# Patient Record
Sex: Male | Born: 1980 | Race: Black or African American | Hispanic: No | Marital: Single | Smoking: Never smoker
Health system: Southern US, Community
[De-identification: ages and names within clinical notes are randomized; demographics above are authoritative.]

---

## 2017-07-27 ENCOUNTER — Other Ambulatory Visit: Payer: Self-pay

## 2017-07-27 ENCOUNTER — Emergency Department (HOSPITAL_COMMUNITY)
Admission: EM | Admit: 2017-07-27 | Discharge: 2017-07-27 | Disposition: A | Discharge status: Home / Self Care | Payer: Self-pay | Attending: Emergency Medicine | Admitting: Emergency Medicine

## 2017-07-27 ENCOUNTER — Encounter (HOSPITAL_COMMUNITY): Payer: Self-pay | Admitting: Emergency Medicine

## 2017-07-27 DIAGNOSIS — J039 Acute tonsillitis, unspecified: Secondary | ICD-10-CM | POA: Insufficient documentation

## 2017-07-27 LAB — RAPID STREP SCREEN (MED CTR MEBANE ONLY): STREPTOCOCCUS, GROUP A SCREEN (DIRECT): NEGATIVE

## 2017-07-27 MED ORDER — CLINDAMYCIN HCL 300 MG PO CAPS: 300.0000 mg | ORAL_CAPSULE | Freq: Three times a day (TID) | ORAL | 0 refills | Status: AC

## 2017-07-27 MED ORDER — CLINDAMYCIN HCL 150 MG PO CAPS
300.0000 mg | ORAL_CAPSULE | Freq: Once | ORAL | Status: AC
  Administered 2017-07-27: 300 mg via ORAL
  Filled 2017-07-27: qty 2

## 2017-07-27 MED ORDER — IBUPROFEN 800 MG PO TABS
800.0000 mg | ORAL_TABLET | Freq: Once | ORAL | Status: AC
  Administered 2017-07-27: 800 mg via ORAL
  Filled 2017-07-27: qty 1

## 2017-07-27 NOTE — Discharge Instructions (Signed)
You may alternate Tylenol 1000 mg every 6 hours as needed for pain and Ibuprofen 800 mg every 8 hours as needed for pain.  Please take Ibuprofen with food.   You may use warm salt water gargles, over-the-counter Chloraseptic spray and throat lozenges to help with your sore throat.   To find a primary care or specialty doctor please call [phone] or [phone] to access "[place] [facility] Find a Doctor Service."  You may also go on the [facility] website at [email or web address]  There are also multiple [facility], [facility], [facility] and [facility] practices throughout the [place] that are frequently accepting new patients. You may find a clinic that is close to your home and contact them.  [facility] -  [address] [place] [id number] [phone]   [facility] -  [address] [place] [address] [phone]   [facility] [phone]  [place] [phone]

## 2017-07-27 NOTE — ED Provider Notes (Signed)
TIME SEEN: 6:49 AM  CHIEF COMPLAINT: Sore throat  HPI: Patient is a 36 year old male with no significant past medical history who presents to the emergency department with sore throat for the past couple of days.  He denies any fevers, cough, vomiting or diarrhea.  Has bilateral ear pain.  No sick contacts or recent travel.  ROS: See HPI Constitutional: no fever  Eyes: no drainage  ENT: no runny nose   Cardiovascular:  no chest pain  Resp: no SOB  GI: no vomiting GU: no dysuria Integumentary: no rash  Allergy: no hives  Musculoskeletal: no leg swelling  Neurological: no slurred speech ROS otherwise negative  PAST MEDICAL HISTORY/PAST SURGICAL HISTORY:  History reviewed. No pertinent past medical history.  MEDICATIONS:  Prior to Admission medications   Not on File    ALLERGIES:  No Known Allergies  SOCIAL HISTORY:  Social History   Tobacco Use  . Smoking status: Never Smoker  . Smokeless tobacco: Never Used  Substance Use Topics  . Alcohol use: No    Frequency: Never    FAMILY HISTORY: No family history on file.  EXAM: BP 127/86 (BP Location: Right Arm)   Pulse 69   Temp 98.4 F (36.9 C) (Oral)   Resp 18   SpO2 96%  CONSTITUTIONAL: Alert and oriented and responds appropriately to questions. Well-appearing; well-nourished HEAD: Normocephalic EYES: Conjunctivae clear, pupils appear equal, EOMI ENT: normal nose; moist mucous membranes; TMs are clear bilaterally without erythema, purulence, bulging, perforation, effusion.  No cerumen impaction or sign of foreign body in the external auditory canal. No inflammation, erythema or drainage from the external auditory canal. No signs of mastoiditis. No pain with manipulation of the pinna bilaterally.  Patient has bilateral tonsillar hypertrophy without exudate but his tonsils are not touching, posterior pharyngeal erythema without petechiae noted, no uvular deviation, no unilateral swelling, no trismus or drooling, no  muffled voice, normal phonation, no stridor, no dental caries present, no drainable dental abscess noted, no Ludwig's angina, tongue sits flat in the bottom of the mouth, no angioedema, no facial erythema or warmth, no facial swelling; no pain with movement of the neck. NECK: Supple, no meningismus, no nuchal rigidity, no LAD  CARD: RRR; S1 and S2 appreciated; no murmurs, no clicks, no rubs, no gallops RESP: Normal chest excursion without splinting or tachypnea; breath sounds clear and equal bilaterally; no wheezes, no rhonchi, no rales, no hypoxia or respiratory distress, speaking full sentences ABD/GI: Normal bowel sounds; non-distended; soft, non-tender, no rebound, no guarding, no peritoneal signs, no hepatosplenomegaly BACK:  The back appears normal and is non-tender to palpation, there is no CVA tenderness EXT: Normal ROM in all joints; non-tender to palpation; no edema; normal capillary refill; no cyanosis, no calf tenderness or swelling    SKIN: Normal color for age and race; warm; no rash NEURO: Moves all extremities equally PSYCH: The patient's mood and manner are appropriate. Grooming and personal hygiene are appropriate.  MEDICAL DECISION MAKING: Patient here with tonsillitis.  His strep test is negative but I am concerned that this could be bacterial in nature.  Will discharge on clindamycin.  He is otherwise well-appearing.  Doubt PTA, deep space neck infection, meningitis, pneumonia.  Recommended alternating Tylenol and Motrin for pain, warm salt water gargles, over-the-counter Chloraseptic spray and throat lozenges.  Will give outpatient PCP follow-up information.  Will give coupon for clindamycin given he does not have insurance.  Discussed return precautions.   Will provide work note.   At  this time, I do not feel there is any life-threatening condition present. I have reviewed and discussed all results (EKG, imaging, lab, urine as appropriate) and exam findings with patient/family. I  have reviewed nursing notes and appropriate previous records.  I feel the patient is safe to be discharged home without further emergent workup and can continue workup as an outpatient as needed. Discussed usual and customary return precautions. Patient/family verbalize understanding and are comfortable with this plan.  Outpatient follow-up has been provided if needed. All questions have been answered.      Ward, Layla MawKristen N, DO 07/27/17 [phone]

## 2017-07-27 NOTE — ED Triage Notes (Signed)
Pt c/o sore throat and ear pain since yesterday morning not getting any better.

## 2017-07-29 LAB — CULTURE, GROUP A STREP (THRC)

## 2018-01-26 ENCOUNTER — Emergency Department (HOSPITAL_COMMUNITY)
Admission: EM | Admit: 2018-01-26 | Discharge: 2018-01-26 | Disposition: A | Discharge status: Home / Self Care | Payer: Self-pay | Attending: Emergency Medicine | Admitting: Emergency Medicine

## 2018-01-26 ENCOUNTER — Encounter (HOSPITAL_COMMUNITY): Payer: Self-pay | Admitting: Emergency Medicine

## 2018-01-26 ENCOUNTER — Other Ambulatory Visit: Payer: Self-pay

## 2018-01-26 DIAGNOSIS — K2900 Acute gastritis without bleeding: Secondary | ICD-10-CM | POA: Insufficient documentation

## 2018-01-26 LAB — COMPREHENSIVE METABOLIC PANEL WITH GFR
ALT: 32 U/L (ref 17–63)
AST: 27 U/L (ref 15–41)
Albumin: 3.9 g/dL (ref 3.5–5.0)
Alkaline Phosphatase: 70 U/L (ref 38–126)
Anion gap: 11 (ref 5–15)
BUN: 9 mg/dL (ref 6–20)
CO2: 25 mmol/L (ref 22–32)
Calcium: 9.3 mg/dL (ref 8.9–10.3)
Chloride: 104 mmol/L (ref 101–111)
Creatinine, Ser: 0.93 mg/dL (ref 0.61–1.24)
GFR calc Af Amer: >60 mL/min
GFR calc non Af Amer: >60 mL/min
Glucose, Bld: 78 mg/dL (ref 65–99)
Potassium: 3.5 mmol/L (ref 3.5–5.1)
Sodium: 140 mmol/L (ref 135–145)
Total Bilirubin: 0.7 mg/dL (ref 0.3–1.2)
Total Protein: 7.5 g/dL (ref 6.5–8.1)

## 2018-01-26 LAB — CBC
HCT: 39.5 % (ref 39.0–52.0)
HEMOGLOBIN: 12.9 g/dL — AB (ref 13.0–17.0)
MCH: 29.8 pg (ref 26.0–34.0)
MCHC: 32.7 g/dL (ref 30.0–36.0)
MCV: 91.2 fL (ref 78.0–100.0)
Platelets: 293 10*3/uL (ref 150–400)
RBC: 4.33 MIL/uL (ref 4.22–5.81)
RDW: 13.9 % (ref 11.5–15.5)
WBC: 6 10*3/uL (ref 4.0–10.5)

## 2018-01-26 LAB — URINALYSIS, ROUTINE W REFLEX MICROSCOPIC
Bilirubin Urine: NEGATIVE
Glucose, UA: NEGATIVE mg/dL
Hgb urine dipstick: NEGATIVE
Ketones, ur: 5 mg/dL — AB
LEUKOCYTES UA: NEGATIVE
NITRITE: NEGATIVE
Protein, ur: NEGATIVE mg/dL
SPECIFIC GRAVITY, URINE: 1.019 (ref 1.005–1.030)
pH: 6 (ref 5.0–8.0)

## 2018-01-26 LAB — LIPASE, BLOOD: Lipase: 33 U/L (ref 11–51)

## 2018-01-26 MED ORDER — RANITIDINE HCL 150 MG PO CAPS: 150.0000 mg | ORAL_CAPSULE | Freq: Every day | ORAL | 0 refills | Status: AC

## 2018-01-26 MED ORDER — OMEPRAZOLE 20 MG PO CPDR: 20.0000 mg | DELAYED_RELEASE_CAPSULE | Freq: Every day | ORAL | 0 refills | Status: AC

## 2018-01-26 NOTE — ED Provider Notes (Addendum)
[facility] EMERGENCY DEPARTMENT Provider Note   CSN: [id number] Arrival date & time: 01/26/18  1115     History   Chief Complaint Chief Complaint  Patient presents with  . Abdominal Pain    HPI Willie Moon is a 37 y.o. male.  HPI  37 year old male presents today with complaints of epigastric abdominal pain.  Patient notes several month history of intermittent pain in the epigastric region.  He notes this is worse after eating spicy or greasy foods.  Patient notes no abdominal pain at rest, non-tender abdomen, denies any lower abdominal pain fever chills nausea or vomiting.  Patient denies any change in stool or urine, no blood per rectum.  Patient has not tried any medications for this.  This is only present when eating spicy or fatty foods.  Patient was at work today and encouraged to come to the emergency room for a work note.  She notes occasional smoking history, occasional alcohol, no NSAID use.  History reviewed. No pertinent past medical history.  There are no active problems to display for this patient.   History reviewed. No pertinent surgical history.      Home Medications    Prior to Admission medications   Medication Sig Start Date End Date Taking? Authorizing Provider  clindamycin (CLEOCIN) 300 MG capsule Take 1 capsule (300 mg total) by mouth 3 (three) times daily. 07/27/17   Ward, Layla Maw, DO  omeprazole (PRILOSEC) 20 MG capsule Take 1 capsule (20 mg total) by mouth daily. 01/26/18   Marletta Bousquet, Tinnie Gens, PA-C  ranitidine (ZANTAC) 150 MG capsule Take 1 capsule (150 mg total) by mouth daily. 01/26/18   Eyvonne Mechanic, PA-C    Family History History reviewed. No pertinent family history.  Social History Social History   Tobacco Use  . Smoking status: Never Smoker  . Smokeless tobacco: Never Used  Substance Use Topics  . Alcohol use: No    Frequency: Never  . Drug use: No     Allergies   Patient has no known allergies.   Review  of Systems Review of Systems  All other systems reviewed and are negative.    Physical Exam Updated Vital Signs BP 128/90 (BP Location: Right Arm)   Pulse 63   Temp 99 F (37.2 C) (Oral)   Resp 16   SpO2 97%   Physical Exam  Constitutional: He is oriented to person, place, and time. He appears well-developed and well-nourished.  HENT:  Head: Normocephalic and atraumatic.  Eyes: Pupils are equal, round, and reactive to light. Conjunctivae are normal. Right eye exhibits no discharge. Left eye exhibits no discharge. No scleral icterus.  Neck: Normal range of motion. No JVD present. No tracheal deviation present.  Pulmonary/Chest: Effort normal. No stridor.  Abdominal:  Soft nontender abdomen no rebound guarding or masses  Neurological: He is alert and oriented to person, place, and time. Coordination normal.  Psychiatric: He has a normal mood and affect. His behavior is normal. Judgment and thought content normal.  Nursing note and vitals reviewed.    ED Treatments / Results  Labs (all labs ordered are listed, but only abnormal results are displayed) Labs Reviewed  CBC - Abnormal; Notable for the following components:      Result Value   Hemoglobin 12.9 (*)    All other components within normal limits  URINALYSIS, ROUTINE W REFLEX MICROSCOPIC - Abnormal; Notable for the following components:   Ketones, ur 5 (*)    All other components  within normal limits  LIPASE, BLOOD  COMPREHENSIVE METABOLIC PANEL    EKG None  Radiology No results found.  Procedures Procedures (including critical care time)  Medications Ordered in ED Medications - No data to display   Initial Impression / Assessment and Plan / ED Course  I have reviewed the triage vital signs and the nursing notes.  Pertinent labs & imaging results that were available during my care of the patient were reviewed by me and considered in my medical decision making (see chart for details).    Labs:  Urinalysis, lipase, CMP, CBC  Imaging:  Consults:  Therapeutics:  Discharge Meds: zantac, omeprazole   Assessment/Plan:   37 year old male presents today with likely gastritis.  No acute distress at time of evaluation.  Very low suspicion for cholecystitis, pancreatitis, gastric ulcer.  Patient started on Zantac, he will transition to omeprazole if symptoms do not improve, dietary counseling given.  Strict return precautions given outpatient follow-up information given.  Patient verbalized understanding and agreement to today's plan.  Final Clinical Impressions(s) / ED Diagnoses   Final diagnoses:  Acute gastritis without hemorrhage, unspecified gastritis type    ED Discharge Orders        Ordered    ranitidine (ZANTAC) 150 MG capsule  Daily     01/26/18 1318    omeprazole (PRILOSEC) 20 MG capsule  Daily     01/26/18 1318       Eyvonne MechanicHedges, Arnel Wymer, PA-C 01/26/18 1319    Tishie Altmann, Tinnie GensJeffrey, PA-C 01/26/18 1325    Arby BarrettePfeiffer, Marcy, MD 02/01/18 1623

## 2018-01-26 NOTE — Discharge Instructions (Signed)
Please read attached information. If you experience any new or worsening signs or symptoms please return to the emergency room for evaluation. Please follow-up with your primary care provider or specialist as discussed. Please use medication prescribed only as directed and discontinue taking if you have any concerning signs or symptoms.   °

## 2018-01-26 NOTE — ED Triage Notes (Signed)
Pt presents to ED for two days of abdominal cramping.  Denies n/v/d, denies changes in urination.  States he needs a work note.

## 2018-01-26 NOTE — ED Notes (Signed)
Upper abd pain started last night hurts worse when he eats greasy food and spicey  Last  BM today was good he states felt a little better but still hurts

## 2018-01-26 NOTE — ED Notes (Signed)
Patient changing into gown, states does not want to take pants off.

## 2019-04-01 ENCOUNTER — Encounter (HOSPITAL_COMMUNITY): Payer: Self-pay | Admitting: Emergency Medicine

## 2019-04-01 ENCOUNTER — Emergency Department (HOSPITAL_COMMUNITY)
Admission: EM | Admit: 2019-04-01 | Discharge: 2019-04-01 | Disposition: A | Discharge status: Home / Self Care | Payer: Medicaid Other | Attending: Emergency Medicine | Admitting: Emergency Medicine

## 2019-04-01 DIAGNOSIS — Z79899 Other long term (current) drug therapy: Secondary | ICD-10-CM | POA: Insufficient documentation

## 2019-04-01 DIAGNOSIS — L089 Local infection of the skin and subcutaneous tissue, unspecified: Secondary | ICD-10-CM | POA: Insufficient documentation

## 2019-04-01 MED ORDER — SULFAMETHOXAZOLE-TRIMETHOPRIM 800-160 MG PO TABS: 1.0000 | ORAL_TABLET | Freq: Two times a day (BID) | ORAL | 0 refills | Status: AC

## 2019-04-01 MED ORDER — HYDROCODONE-ACETAMINOPHEN 5-325 MG PO TABS
1.0000 | ORAL_TABLET | Freq: Once | ORAL | Status: AC
  Administered 2019-04-01: 1 via ORAL
  Filled 2019-04-01: qty 1

## 2019-04-01 NOTE — ED Triage Notes (Signed)
Pt here for eval of abscess to right thumb since yesterday.

## 2019-04-01 NOTE — Discharge Instructions (Addendum)
Soak finger 20 minutes 4 times a day
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
## 2019-06-26 ENCOUNTER — Emergency Department (HOSPITAL_COMMUNITY)
Admission: EM | Admit: 2019-06-26 | Discharge: 2019-06-26 | Disposition: A | Discharge status: Home / Self Care | Payer: Self-pay | Attending: Emergency Medicine | Admitting: Emergency Medicine

## 2019-06-26 ENCOUNTER — Other Ambulatory Visit: Payer: Self-pay

## 2019-06-26 ENCOUNTER — Encounter (HOSPITAL_COMMUNITY): Payer: Self-pay | Admitting: Emergency Medicine

## 2019-06-26 DIAGNOSIS — Z79899 Other long term (current) drug therapy: Secondary | ICD-10-CM | POA: Insufficient documentation

## 2019-06-26 DIAGNOSIS — F419 Anxiety disorder, unspecified: Secondary | ICD-10-CM | POA: Insufficient documentation

## 2019-06-26 MED ORDER — HYDROXYZINE HCL 25 MG PO TABS: 25.0000 mg | ORAL_TABLET | Freq: Four times a day (QID) | ORAL | 0 refills | Status: AC

## 2019-06-26 NOTE — ED Provider Notes (Signed)
[facility] EMERGENCY DEPARTMENT Provider Note   CSN: [id number] Arrival date & time: 06/26/19  1036    History   Chief Complaint Chief Complaint  Patient presents with  . Anxiety    HPI MAKIH STEFANKO is a 38 y.o. male.     HPI   38 year old male presents today with complaints of anxiety patient notes he was drinking this weekend.  After waking up he felt like he was having palpitations and slightly anxious.  Patient denies any significant chest pain or any other complaints.  Patient denies any significant past medical history of anxiety.  He denies any cardiac history, he notes he does not use drugs.        History reviewed. No pertinent past medical history.  There are no active problems to display for this patient.   History reviewed. No pertinent surgical history.      Home Medications    Prior to Admission medications   Medication Sig Start Date End Date Taking? Authorizing Provider  clindamycin (CLEOCIN) 300 MG capsule Take 1 capsule (300 mg total) by mouth 3 (three) times daily. 07/27/17   Ward, Delice Bison, DO  hydrOXYzine (ATARAX/VISTARIL) 25 MG tablet Take 1 tablet (25 mg total) by mouth every 6 (six) hours. 06/26/19   Beulah Capobianco, Dellis Filbert, PA-C  omeprazole (PRILOSEC) 20 MG capsule Take 1 capsule (20 mg total) by mouth daily. 01/26/18   [place] Nabor, Dellis Filbert, PA-C  ranitidine (ZANTAC) 150 MG capsule Take 1 capsule (150 mg total) by mouth daily. 01/26/18   Okey Regal, PA-C    Family History No family history on file.  Social History Social History   Tobacco Use  . Smoking status: Never Smoker  . Smokeless tobacco: Never Used  Substance Use Topics  . Alcohol use: Yes    Frequency: Never    Comment: socially   . Drug use: No     Allergies   Patient has no known allergies.   Review of Systems Review of Systems  All other systems reviewed and are negative.    Physical Exam Updated Vital Signs BP (!) 138/103 (BP Location: Left  Arm)   Pulse (!) 56   Temp 98.6 F (37 C) (Oral)   Resp 15   Ht 5\' 11"  (1.803 m)   Wt 97.5 kg   SpO2 97%   BMI 29.99 kg/m   Physical Exam Vitals signs and nursing note reviewed.  Constitutional:      Appearance: He is well-developed.  HENT:     Head: Normocephalic and atraumatic.  Eyes:     General: No scleral icterus.       Right eye: No discharge.        Left eye: No discharge.     Conjunctiva/sclera: Conjunctivae normal.     Pupils: Pupils are equal, round, and reactive to light.  Neck:     Musculoskeletal: Normal range of motion.     Vascular: No JVD.     Trachea: No tracheal deviation.  Cardiovascular:     Rate and Rhythm: Normal rate and regular rhythm.  Pulmonary:     Effort: Pulmonary effort is normal. No respiratory distress.     Breath sounds: Normal breath sounds. No stridor.  Neurological:     Mental Status: He is alert and oriented to person, place, and time.     Coordination: Coordination normal.  Psychiatric:        Behavior: Behavior normal.        Thought Content: Thought content  normal.        Judgment: Judgment normal.      ED Treatments / Results  Labs (all labs ordered are listed, but only abnormal results are displayed) Labs Reviewed - No data to display  EKG None  Radiology No results found.  Procedures Procedures (including critical care time)  Medications Ordered in ED Medications - No data to display   Initial Impression / Assessment and Plan / ED Course  I have reviewed the triage vital signs and the nursing notes.  Pertinent labs & imaging results that were available during my care of the patient were reviewed by me and considered in my medical decision making (see chart for details).      38 year old male presents today with anxiety.  He has no signs of cardiac etiology including ACS PE or any other life-threatening etiology.  Patient discharged with symptomatic care and return precautions.  He verbalized understanding  and agreement to today's plan.  Final Clinical Impressions(s) / ED Diagnoses   Final diagnoses:  Anxiety    ED Discharge Orders         Ordered    hydrOXYzine (ATARAX/VISTARIL) 25 MG tablet  Every 6 hours     06/26/19 1409           Eyvonne Mechanic, PA-C 06/26/19 1549    Lorre Nick, MD 06/28/19 1319

## 2019-06-26 NOTE — Discharge Instructions (Addendum)
Please read attached information. If you experience any new or worsening signs or symptoms please return to the emergency room for evaluation. Please follow-up with your primary care provider or specialist as discussed. Please use medication prescribed only as directed and discontinue taking if you have any concerning signs or symptoms.   °

## 2019-06-26 NOTE — ED Triage Notes (Signed)
Patient reports feeling very anxious and nervous. Denies any changes that would cause him to feel this way. Denies any ingestion. Denies any SI/HI. Requesting something to help him relax and calm down.

## 2019-08-08 ENCOUNTER — Other Ambulatory Visit: Payer: Medicaid Other

## 2020-02-25 ENCOUNTER — Emergency Department (HOSPITAL_COMMUNITY)
Admission: EM | Admit: 2020-02-25 | Discharge: 2020-02-25 | Disposition: A | Discharge status: Home / Self Care | Payer: Medicaid Other | Attending: Emergency Medicine | Admitting: Emergency Medicine

## 2020-02-25 ENCOUNTER — Emergency Department (HOSPITAL_COMMUNITY): Payer: Medicaid Other

## 2020-02-25 ENCOUNTER — Encounter (HOSPITAL_COMMUNITY): Payer: Self-pay

## 2020-02-25 ENCOUNTER — Other Ambulatory Visit: Payer: Self-pay

## 2020-02-25 DIAGNOSIS — M25512 Pain in left shoulder: Secondary | ICD-10-CM | POA: Insufficient documentation

## 2020-02-25 DIAGNOSIS — X509XXA Other and unspecified overexertion or strenuous movements or postures, initial encounter: Secondary | ICD-10-CM | POA: Insufficient documentation

## 2020-02-25 DIAGNOSIS — Z79899 Other long term (current) drug therapy: Secondary | ICD-10-CM | POA: Insufficient documentation

## 2020-02-25 DIAGNOSIS — Y9384 Activity, sleeping: Secondary | ICD-10-CM | POA: Insufficient documentation

## 2020-02-25 DIAGNOSIS — M542 Cervicalgia: Secondary | ICD-10-CM | POA: Insufficient documentation

## 2020-02-25 DIAGNOSIS — Y92003 Bedroom of unspecified non-institutional (private) residence as the place of occurrence of the external cause: Secondary | ICD-10-CM | POA: Insufficient documentation

## 2020-02-25 DIAGNOSIS — Y999 Unspecified external cause status: Secondary | ICD-10-CM | POA: Insufficient documentation

## 2020-02-25 MED ORDER — METHOCARBAMOL 500 MG PO TABS: 500.0000 mg | ORAL_TABLET | Freq: Two times a day (BID) | ORAL | 0 refills | Status: AC

## 2020-02-25 NOTE — Discharge Instructions (Addendum)
Please continue to monitor your symptoms.  Please take Tylenol ibuprofen for pain as described below.  I also prescribed you a muscle relaxer which you may use if your pain is severe.  Please do gentle stretching and minimize heavy lifting.  You may do range of motion exercises as discussed.  Please use Tylenol or ibuprofen for pain.  You may use 600 mg ibuprofen every 6 hours or 1000 mg of Tylenol every 6 hours.  You may choose to alternate between the 2.  This would be most effective.  Not to exceed 4 g of Tylenol within 24 hours.  Not to exceed 3200 mg ibuprofen 24 hours.  Your x-ray was reassuring with no evidence of fracture.  Follow-up with your primary care doctor.  I have also given you the number for orthopedic doctor as you can to have symptoms and need to have a follow-up appoint with them.

## 2020-02-25 NOTE — ED Notes (Signed)
Patient verbalizes understanding of discharge instructions. Opportunity for questioning and answers were provided. Armband removed by staff. Patient discharged from ED.  

## 2020-02-25 NOTE — ED Triage Notes (Signed)
Pt reports 2 days of left shoulder pain, worse with movement. Pt denies any injury or trauma.

## 2020-02-26 NOTE — ED Provider Notes (Signed)
[facility] EMERGENCY DEPARTMENT Provider Note   CSN: [id number] Arrival date & time: 02/25/20  1018     History Chief Complaint  Patient presents with  . Shoulder Pain    Willie Moon is a 39 y.o. male.  HPI  Patient is a 39 year old male presenting today with 2 days of left shoulder pain that is only present when moving the area.  He denies any injury or trauma but states that it first began aching when he woke up after was sleeping on it.  He states that he was in his usual bed denies any trauma or injuries.  Denies any numbness or tingling in his hand.  Endorses an [employer] operational [employer].  Has taken nothing prior to arrival for pain.  No chest pain, shortness of breath, nausea or diaphoresis.     History reviewed. No pertinent past medical history.  There are no problems to display for this patient.   History reviewed. No pertinent surgical history.     No family history on file.  Social History   Tobacco Use  . Smoking status: Never Smoker  . Smokeless tobacco: Never Used  Substance Use Topics  . Alcohol use: Yes    Comment: socially   . Drug use: No    Home Medications Prior to Admission medications   Medication Sig Start Date End Date Taking? Authorizing Provider  clindamycin (CLEOCIN) 300 MG capsule Take 1 capsule (300 mg total) by mouth 3 (three) times daily. 07/27/17   Ward, Layla Maw, DO  hydrOXYzine (ATARAX/VISTARIL) 25 MG tablet Take 1 tablet (25 mg total) by mouth every 6 (six) hours. 06/26/19   Hedges, Tinnie Gens, PA-C  methocarbamol (ROBAXIN) 500 MG tablet Take 1 tablet (500 mg total) by mouth 2 (two) times daily. 02/25/20   Gailen Shelter, PA  omeprazole (PRILOSEC) 20 MG capsule Take 1 capsule (20 mg total) by mouth daily. 01/26/18   Hedges, Tinnie Gens, PA-C  ranitidine (ZANTAC) 150 MG capsule Take 1 capsule (150 mg total) by mouth daily. 01/26/18   Eyvonne Mechanic, PA-C    Allergies    Patient has no known allergies.  Review of Systems     Review of Systems  Constitutional: Negative for chills and fever.  HENT: Negative for congestion.   Respiratory: Negative for shortness of breath.   Cardiovascular: Negative for chest pain.  Gastrointestinal: Negative for abdominal pain.  Musculoskeletal: Negative for neck pain.       Left shoulder pain    Physical Exam Updated Vital Signs BP (!) 126/93 (BP Location: Right Arm)   Pulse (!) 58   Temp 98.9 F (37.2 C) (Oral)   Resp 18   Ht 5\' 11"  (1.803 m)   Wt 99.8 kg   SpO2 99%   BMI 30.68 kg/m   Physical Exam Vitals and nursing note reviewed.  Constitutional:      General: He is not in acute distress.    Appearance: Normal appearance. He is not ill-appearing.  HENT:     Head: Normocephalic and atraumatic.  Eyes:     General: No scleral icterus.       Right eye: No discharge.        Left eye: No discharge.     Conjunctiva/sclera: Conjunctivae normal.  Cardiovascular:     Comments: Bilateral radial pulses 3+ and symmetric.  Regular rate. Pulmonary:     Effort: Pulmonary effort is normal.     Breath sounds: No stridor.  Musculoskeletal:     Comments:  Tenderness to palpation along the lateral deltoid and the left trapezius and left lateral neck.  There are trigger points that reproduces the exact pain.  Full range of motion of left shoulder and 5/5 strength in all upper extremity joints.  Skin:    General: Skin is warm and dry.     Capillary Refill: Capillary refill takes less than 2 seconds.  Neurological:     Mental Status: He is alert and oriented to person, place, and time. Mental status is at baseline.     Comments: Sensation intact bilateral lower and upper extremities. Bilateral hands have motor function in the radial ulnar and median nerve distribution and sensation in these areas as well.     ED Results / Procedures / Treatments   Labs (all labs ordered are listed, but only abnormal results are displayed) Labs Reviewed - No data to  display  EKG None  Radiology DG Shoulder Left  Result Date: 02/25/2020 CLINICAL DATA:  Shoulder pain.  No injury. EXAM: LEFT SHOULDER - 2+ VIEW COMPARISON:  None. FINDINGS: There is no evidence of fracture or dislocation. There is no evidence of arthropathy or other focal bone abnormality. Soft tissues are unremarkable. IMPRESSION: Negative. Electronically Signed   By: Norva Pavlov M.D.   On: 02/25/2020 11:09    Procedures Procedures (including critical care time)  Medications Ordered in ED Medications - No data to display  ED Course  I have reviewed the triage vital signs and the nursing notes.  Pertinent labs & imaging results that were available during my care of the patient were reviewed by me and considered in my medical decision making (see chart for details).    MDM Rules/Calculators/A&P                          Patient has reproducible muscular tenderness in the left shoulder.  He has no cardiac history and is denying any chest pain or shortness of breath.  His symptoms of been ongoing for 2 days ever since he slept on his left shoulder woke up with the ache.  He has taken nothing prior to arrival.  We will treat conservatively at this time.  He has unremarkable physical exam with muscular tenderness.  This tenderness reproduces his symptoms.  Vital signs within normal limits.  Will discharge patient with muscle relaxer and Tylenol and ibuprofen as well as shoulder range of motion exercises to prevent adhesive capsulitis   Final Clinical Impression(s) / ED Diagnoses Final diagnoses:  Acute pain of left shoulder    Rx / DC Orders ED Discharge Orders         Ordered    methocarbamol (ROBAXIN) 500 MG tablet  2 times daily     Discontinue  Reprint     02/25/20 1129           Gailen Shelter, [place] 02/26/20 1300    Margarita Grizzle, MD 02/26/20 1316

## 2021-07-11 ENCOUNTER — Encounter (HOSPITAL_BASED_OUTPATIENT_CLINIC_OR_DEPARTMENT_OTHER): Payer: Self-pay

## 2021-07-11 DIAGNOSIS — R590 Localized enlarged lymph nodes: Secondary | ICD-10-CM | POA: Insufficient documentation

## 2021-07-11 DIAGNOSIS — Z20822 Contact with and (suspected) exposure to covid-19: Secondary | ICD-10-CM | POA: Insufficient documentation

## 2021-07-11 DIAGNOSIS — R519 Headache, unspecified: Secondary | ICD-10-CM | POA: Insufficient documentation

## 2021-07-11 DIAGNOSIS — J029 Acute pharyngitis, unspecified: Secondary | ICD-10-CM | POA: Insufficient documentation

## 2021-07-11 NOTE — ED Triage Notes (Addendum)
Pt is present for left sided facial pain that radiates to his left ear since yesterday. Pt states it is also painful to swallow. Denies difficulty breathing. Afebrile. Last took tylenol at 1900.

## 2021-07-12 ENCOUNTER — Emergency Department (HOSPITAL_BASED_OUTPATIENT_CLINIC_OR_DEPARTMENT_OTHER)
Admission: EM | Admit: 2021-07-12 | Discharge: 2021-07-12 | Disposition: A | Discharge status: Home / Self Care | Payer: PRIVATE HEALTH INSURANCE | Attending: Emergency Medicine | Admitting: Emergency Medicine

## 2021-07-12 DIAGNOSIS — J029 Acute pharyngitis, unspecified: Secondary | ICD-10-CM

## 2021-07-12 LAB — RESP PANEL BY RT-PCR (FLU A&B, COVID) ARPGX2
Influenza A by PCR: NEGATIVE
Influenza B by PCR: NEGATIVE
SARS Coronavirus 2 by RT PCR: NEGATIVE

## 2021-07-12 LAB — GROUP A STREP BY PCR: Group A Strep by PCR: NOT DETECTED

## 2021-07-12 MED ORDER — IBUPROFEN 600 MG PO TABS: 600.0000 mg | ORAL_TABLET | Freq: Four times a day (QID) | ORAL | 0 refills | Status: DC | PRN

## 2021-07-12 MED ORDER — DEXAMETHASONE SODIUM PHOSPHATE 10 MG/ML IJ SOLN
10.0000 mg | Freq: Once | INTRAMUSCULAR | Status: AC
  Administered 2021-07-12: 10 mg via INTRAMUSCULAR
  Filled 2021-07-12: qty 1

## 2021-07-12 NOTE — Discharge Instructions (Signed)
You were seen today for sore throat and ear pain.  Your physical exam suggest pharyngitis.  Your strep and COVID screening are negative.  Use warm salt water gargles.  Take 600 mg of ibuprofen every 6 hours as needed.  Follow-up with your primary physician in 2 to 3 days if not improving

## 2021-07-12 NOTE — ED Provider Notes (Signed)
[facility] EMERGENCY DEPT Provider Note   CSN: [id number] Arrival date & time: 07/11/21  2245     History Chief Complaint  Patient presents with   Facial Pain    Willie Moon is a 40 y.o. male.  HPI     This 40 year old male who presents with left-sided facial pain and sore throat.  Patient noted symptoms yesterday.  He reports pain in the left face and neck that radiates into his left ear.  He also reports pain with swallowing.  No fevers.  No known sick contacts.  Denies congestion, shortness of breath, chest pain, cough.  Patient took Tylenol with minimal relief.  Rates his pain at 8 out of 10.  History reviewed. No pertinent past medical history.  There are no problems to display for this patient.   History reviewed. No pertinent surgical history.     No family history on file.  Social History   Tobacco Use   Smoking status: Never   Smokeless tobacco: Never  Substance Use Topics   Alcohol use: Yes    Comment: socially    Drug use: No    Home Medications Prior to Admission medications   Medication Sig Start Date End Date Taking? Authorizing Provider  ibuprofen (ADVIL) 600 MG tablet Take 1 tablet (600 mg total) by mouth every 6 (six) hours as needed. 07/12/21  Yes Numan Zylstra, Mayer Masker, MD  clindamycin (CLEOCIN) 300 MG capsule Take 1 capsule (300 mg total) by mouth 3 (three) times daily. 07/27/17   Ward, Layla Maw, DO  hydrOXYzine (ATARAX/VISTARIL) 25 MG tablet Take 1 tablet (25 mg total) by mouth every 6 (six) hours. 06/26/19   Hedges, Tinnie Gens, PA-C  methocarbamol (ROBAXIN) 500 MG tablet Take 1 tablet (500 mg total) by mouth 2 (two) times daily. 02/25/20   Gailen Shelter, PA  omeprazole (PRILOSEC) 20 MG capsule Take 1 capsule (20 mg total) by mouth daily. 01/26/18   Hedges, Tinnie Gens, PA-C  ranitidine (ZANTAC) 150 MG capsule Take 1 capsule (150 mg total) by mouth daily. 01/26/18   Eyvonne Mechanic, PA-C    Allergies    Patient has no known  allergies.  Review of Systems   Review of Systems  Constitutional:  Negative for fever.  HENT:  Positive for sore throat. Negative for trouble swallowing.   Respiratory:  Negative for shortness of breath.   Cardiovascular:  Negative for chest pain.  Musculoskeletal:  Negative for myalgias and neck pain.  All other systems reviewed and are negative.  Physical Exam Updated Vital Signs BP (!) 126/95 (BP Location: Right Arm)   Pulse 68   Temp 97.7 F (36.5 C) (Oral)   Resp 19   Ht 1.803 m (5\' 11" )   Wt 99.8 kg   SpO2 98%   BMI 30.68 kg/m   Physical Exam Vitals and nursing note reviewed.  Constitutional:      Appearance: He is well-developed. He is obese.  HENT:     Head: Normocephalic and atraumatic.     Right Ear: Tympanic membrane normal.     Left Ear: Tympanic membrane normal.     Mouth/Throat:     Mouth: Mucous membranes are moist.     Comments: 2+ bilateral tonsillar swelling, craters noted, no exudate, uvula midline Eyes:     Pupils: Pupils are equal, round, and reactive to light.  Neck:     Comments: Tender lymphadenopathy left greater than right Cardiovascular:     Rate and Rhythm: Normal rate and regular rhythm.  Heart sounds: Normal heart sounds. No murmur heard. Pulmonary:     Effort: Pulmonary effort is normal. No respiratory distress.     Breath sounds: Normal breath sounds. No wheezing.  Abdominal:     Palpations: Abdomen is soft.     Tenderness: There is no abdominal tenderness. There is no rebound.  Musculoskeletal:     Cervical back: Neck supple.     Right lower leg: No edema.     Left lower leg: No edema.  Lymphadenopathy:     Cervical: Cervical adenopathy present.  Skin:    General: Skin is warm and dry.  Neurological:     Mental Status: He is alert and oriented to person, place, and time.  Psychiatric:        Mood and Affect: Mood normal.    ED Results / Procedures / Treatments   Labs (all labs ordered are listed, but only abnormal  results are displayed) Labs Reviewed  GROUP A STREP BY PCR  RESP PANEL BY RT-PCR (FLU A&B, COVID) [id number]    EKG None  Radiology No results found.  Procedures Procedures   Medications Ordered in ED Medications  dexamethasone (DECADRON) injection 10 mg (10 mg Intramuscular Given 07/12/21 0118)    ED Course  I have reviewed the triage vital signs and the nursing notes.  Pertinent labs & imaging results that were available during my care of the patient were reviewed by me and considered in my medical decision making (see chart for details).    MDM Rules/Calculators/A&P                           Patient presents with left-sided facial pain and sore throat.  He is nontoxic and vital signs are reassuring.  He is afebrile.  Airway is intact.  He does have bilateral tonsillar swelling.  There is no asymmetry no physical exam indications for deep space infection.  There is no exudate.  He is afebrile.  Low suspicion for strep but will screen given primary complaint of sore throat.  He does have some tender lymphadenopathy on the left.  Bilateral TMs are clear and have no suspicion of otitis media.  COVID, influenza, and strep testing were sent.  This is all negative.  Patient was given a dose of Decadron.  Highly suspect viral etiology.  Recommend scheduled anti-inflammatories and salt water gargles.  Follow-up with primary physician in 2 to 3 days if not improving.  After history, exam, and medical workup I feel the patient has been appropriately medically screened and is safe for discharge home. Pertinent diagnoses were discussed with the patient. Patient was given return precautions.  Final Clinical Impression(s) / ED Diagnoses Final diagnoses:  Viral pharyngitis    Rx / DC Orders ED Discharge Orders          Ordered    ibuprofen (ADVIL) 600 MG tablet  Every 6 hours PRN        07/12/21 0216             Shon Baton, MD 07/12/21 [phone]

## 2022-01-12 IMAGING — CR DG SHOULDER 2+V*L*
3 series · 3 of 3 positions shown · non-contrast
Comparison: None.

CLINICAL DATA: Shoulder pain.  No injury.

EXAM:
LEFT SHOULDER - 2+ VIEW

[shoulder grashey]
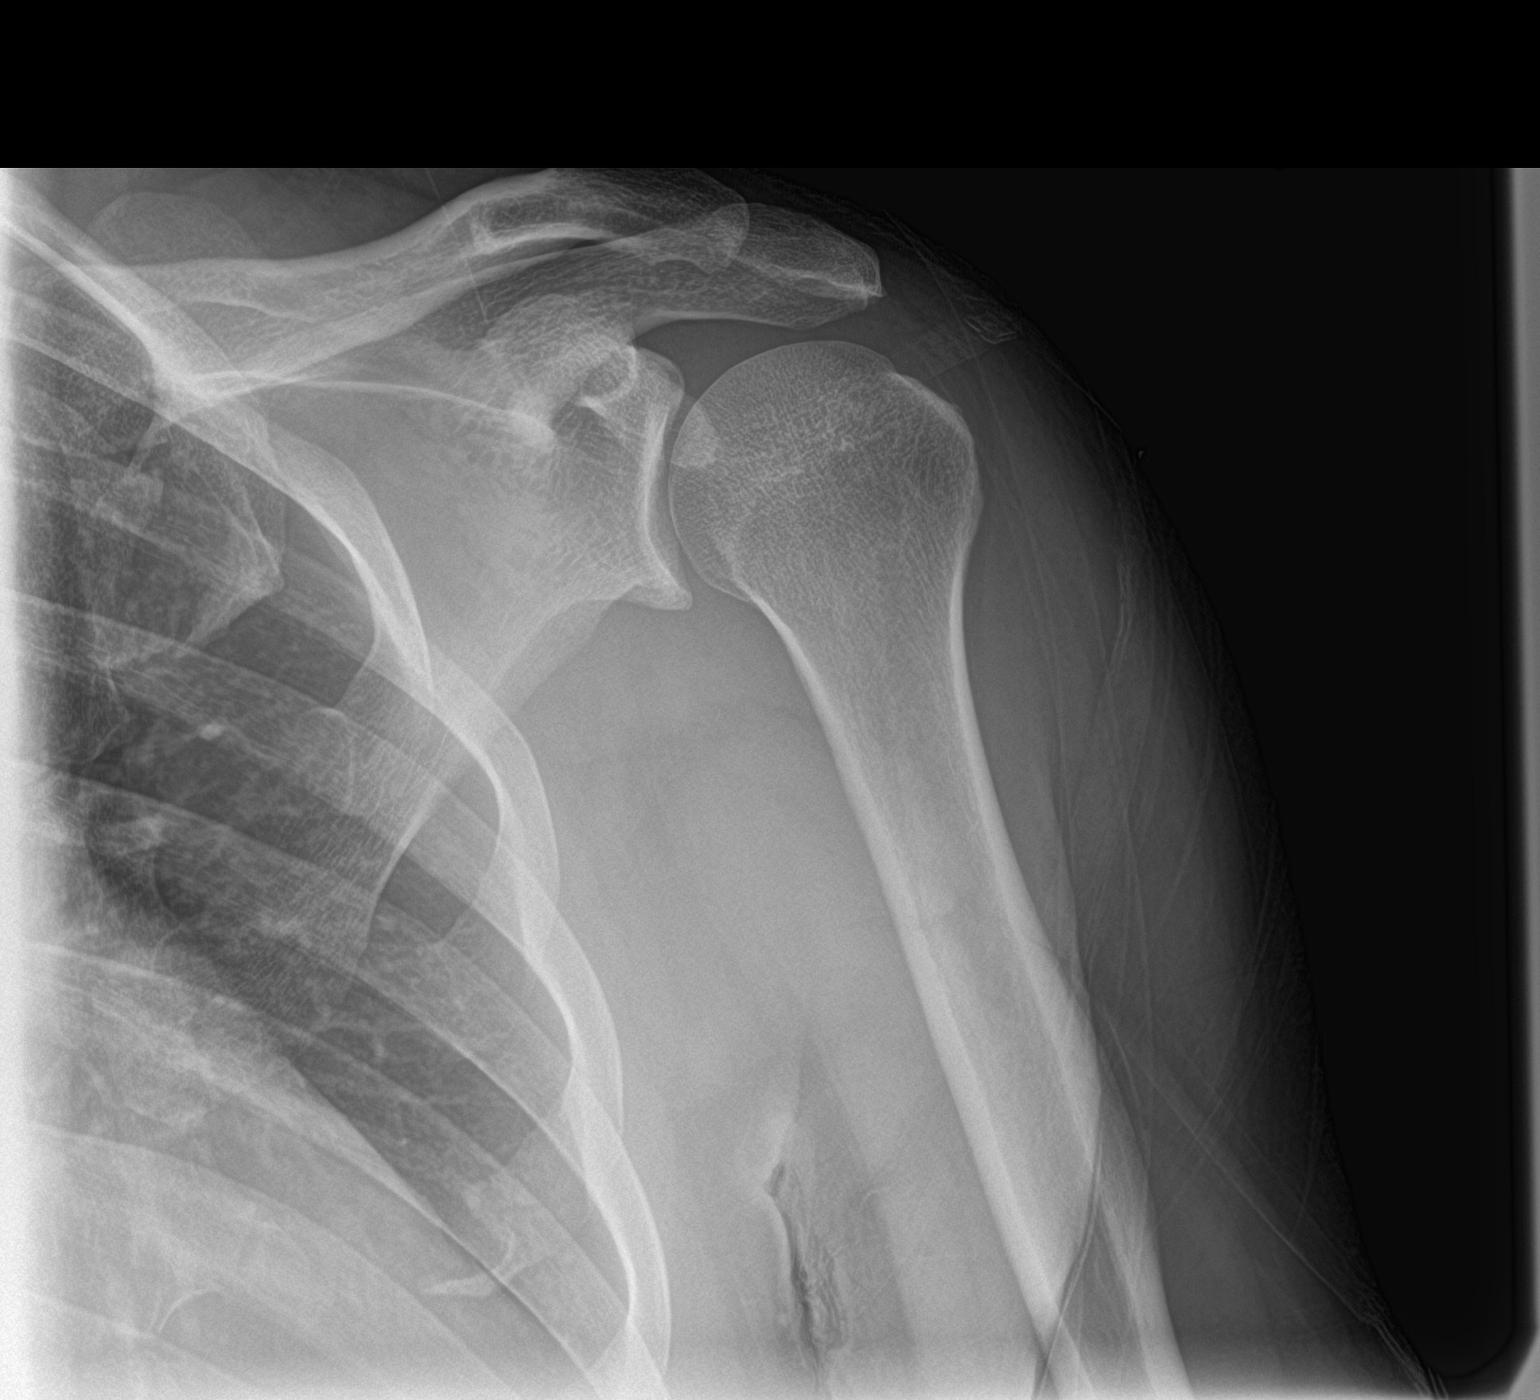

[shoulder y view]
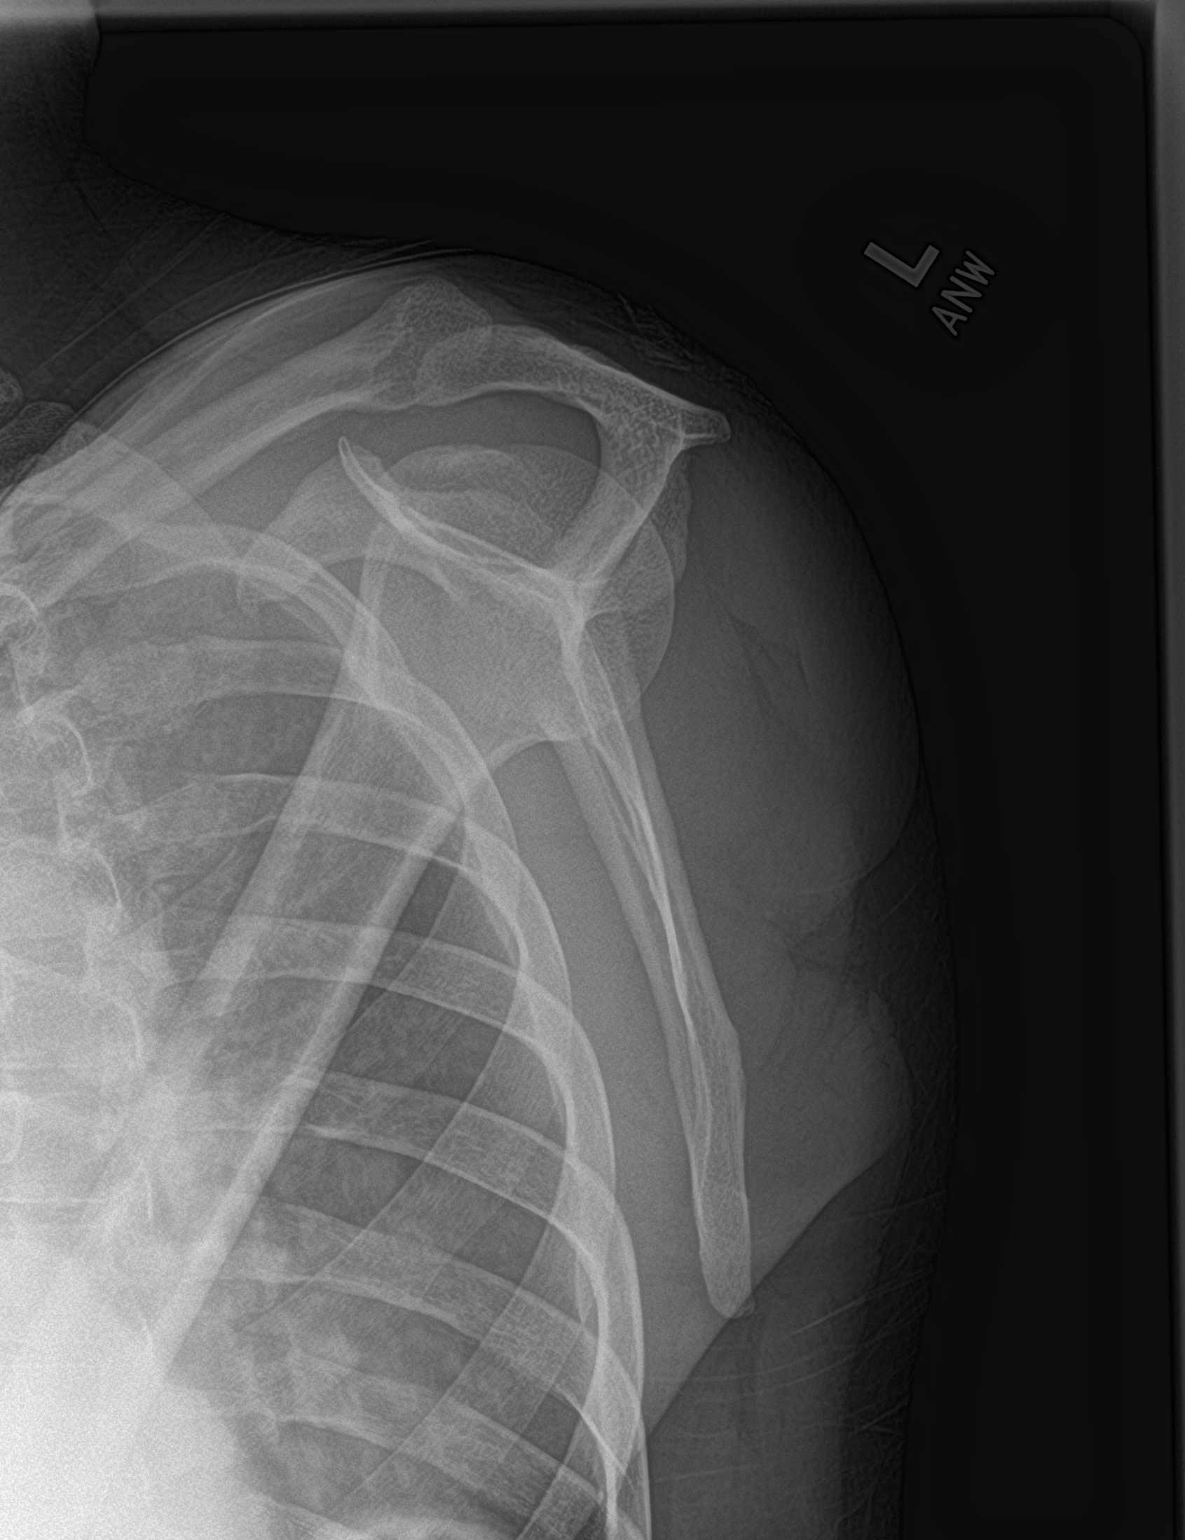

[shoulder axillary]
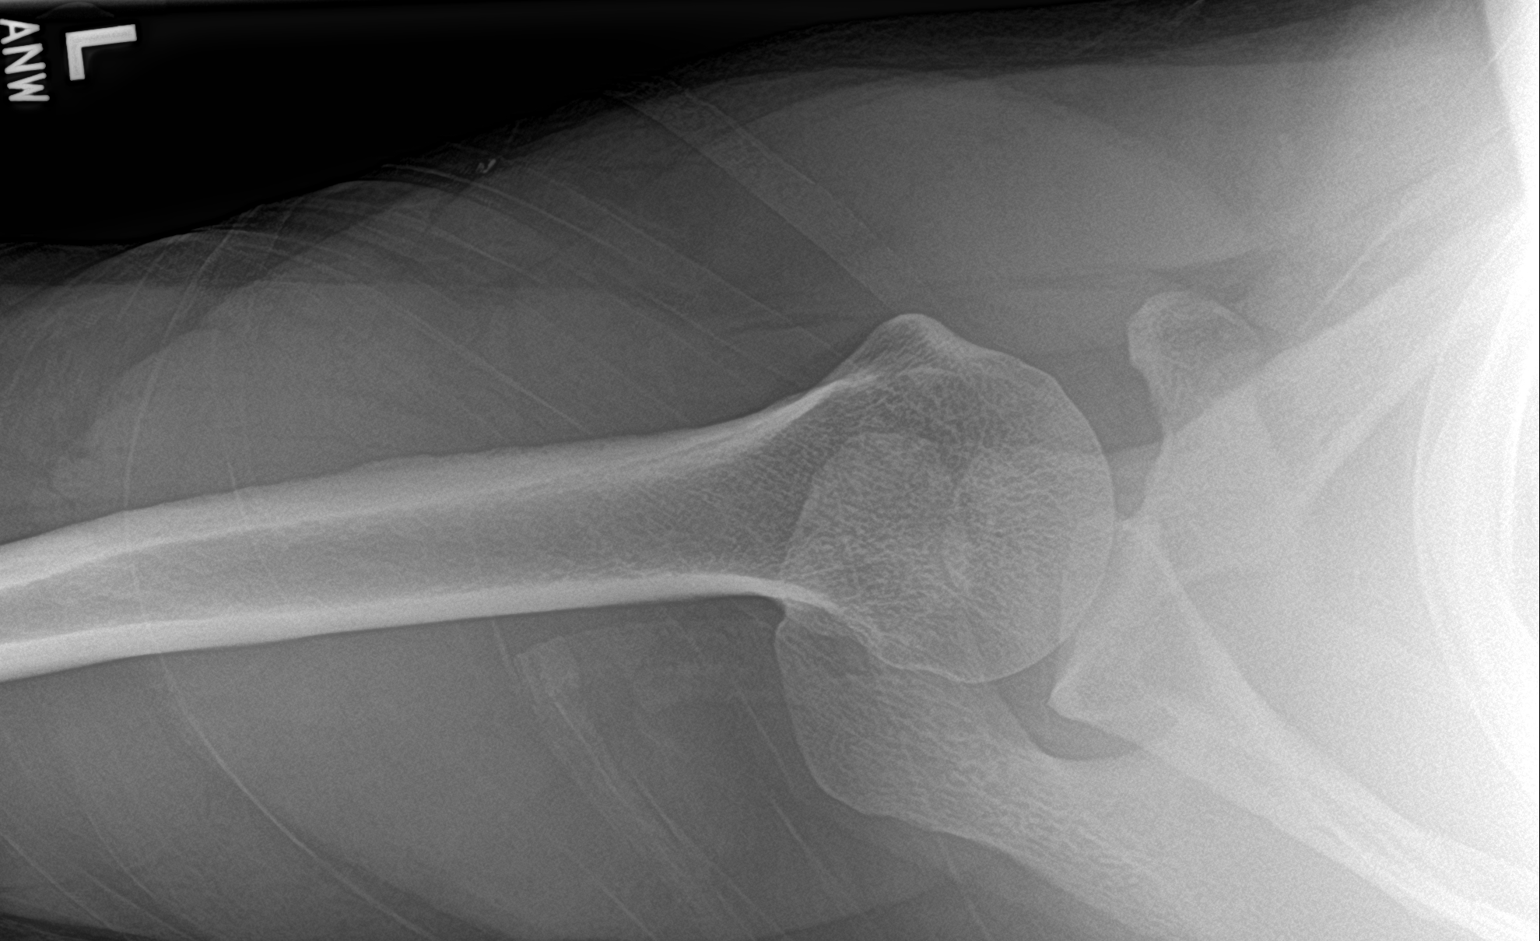

[3 of 3 positions shown; findings below may reference images not displayed]

FINDINGS: There is no evidence of fracture or dislocation. There is no
evidence of arthropathy or other focal bone abnormality. Soft
tissues are unremarkable.
IMPRESSION: Negative.

## 2022-06-06 ENCOUNTER — Other Ambulatory Visit: Payer: Self-pay

## 2022-06-06 ENCOUNTER — Encounter (HOSPITAL_BASED_OUTPATIENT_CLINIC_OR_DEPARTMENT_OTHER): Payer: Self-pay

## 2022-06-06 DIAGNOSIS — R209 Unspecified disturbances of skin sensation: Secondary | ICD-10-CM | POA: Insufficient documentation

## 2022-06-06 DIAGNOSIS — Z5321 Procedure and treatment not carried out due to patient leaving prior to being seen by health care provider: Secondary | ICD-10-CM | POA: Insufficient documentation

## 2022-06-06 DIAGNOSIS — K0889 Other specified disorders of teeth and supporting structures: Secondary | ICD-10-CM | POA: Insufficient documentation

## 2022-06-06 NOTE — ED Triage Notes (Addendum)
Patient here POV from Home.  Endorses Dental pain for approximately 2 Days. States also for approximately 1 Week he has been having this Radiating Pain that begins in his Neck and radiates to his Entire Left Hand. Also states there is a Numb Sensation to his Left Second Distal Digit constantly.   NAD noted during Triage. A&Ox4. GCS 15. Ambulatory.

## 2022-06-07 ENCOUNTER — Emergency Department (HOSPITAL_BASED_OUTPATIENT_CLINIC_OR_DEPARTMENT_OTHER)
Admission: EM | Admit: 2022-06-07 | Discharge: 2022-06-07 | Discharge status: Against Medical Advice | Payer: Medicaid Other | Attending: Emergency Medicine | Admitting: Emergency Medicine

## 2022-06-07 NOTE — ED Notes (Addendum)
Called pt x3 for updated vitals. No response.

## 2022-09-12 ENCOUNTER — Other Ambulatory Visit: Payer: Self-pay

## 2022-09-12 ENCOUNTER — Emergency Department (HOSPITAL_BASED_OUTPATIENT_CLINIC_OR_DEPARTMENT_OTHER)
Admission: EM | Admit: 2022-09-12 | Discharge: 2022-09-12 | Disposition: A | Discharge status: Home / Self Care | Payer: Medicaid Other | Attending: Medical | Admitting: Medical

## 2022-09-12 ENCOUNTER — Emergency Department (HOSPITAL_BASED_OUTPATIENT_CLINIC_OR_DEPARTMENT_OTHER): Payer: Medicaid Other

## 2022-09-12 DIAGNOSIS — W57XXXA Bitten or stung by nonvenomous insect and other nonvenomous arthropods, initial encounter: Secondary | ICD-10-CM | POA: Insufficient documentation

## 2022-09-12 DIAGNOSIS — S6991XA Unspecified injury of right wrist, hand and finger(s), initial encounter: Secondary | ICD-10-CM

## 2022-09-12 DIAGNOSIS — S60361A Insect bite (nonvenomous) of right thumb, initial encounter: Secondary | ICD-10-CM | POA: Insufficient documentation

## 2022-09-12 MED ORDER — CEPHALEXIN 500 MG PO CAPS: 500.0000 mg | ORAL_CAPSULE | Freq: Four times a day (QID) | ORAL | 0 refills | Status: AC

## 2022-09-12 NOTE — ED Notes (Signed)
Xray at BS 

## 2022-09-12 NOTE — Discharge Instructions (Signed)
At this time there does not appear to be the presence of an emergent medical condition, however there is always the potential for conditions to change. Please read and follow the below instructions.  Please return to the Emergency Department immediately for any new or worsening symptoms or if your symptoms do not improve within 3 days. Please be sure to follow up with your Primary Care Provider within one week regarding your visit today; please call their office to schedule an appointment even if you are feeling better for a follow-up visit. You may take the antibiotic Keflex as prescribed to help treat potential infection.  Please drink plenty of water and get plenty of rest. Please call the hand specialist Dr. Sammuel Hines on your discharge paperwork to schedule follow-up appointment for recheck of your thumb.  If you develop any worsening symptoms in the meantime please return immediately to the emergency department.  Worsening symptoms include fever, chills, redness, swelling, increased pain, drainage.   Please read the additional information packets attached to your discharge summary.  Go to the nearest Emergency Department immediately if: You have fever or chills You have a red streak of skin near the area around your wound. Pus or a bad smell coming from the wound. Your wound has been closed with staples, sutures, skin glue, or adhesive strips and it begins to open up and separate. Your wound is bleeding, and the bleeding does not stop with gentle pressure. You have any new/concerning or worsening symptoms.  Do not take your medicine if  develop an itchy rash, swelling in your mouth or lips, or difficulty breathing; call 911 and seek immediate emergency medical attention if this occurs.  You may review your lab tests and imaging results in their entirety on your MyChart account.  Please discuss all results of fully with your primary care provider and other specialist at your follow-up  visit.  Note: Portions of this text may have been transcribed using voice recognition software. Every effort was made to ensure accuracy; however, inadvertent computerized transcription errors may still be present.

## 2022-09-12 NOTE — ED Notes (Signed)
ED PA at BS 

## 2022-09-12 NOTE — ED Triage Notes (Signed)
2 days ago felt something bite him on his right thumb.. started itching and stinging. Today is tight/swollen, painful/red and he is itchingup his arm.

## 2022-09-12 NOTE — ED Provider Notes (Signed)
[facility] Provider Note   CSN: [id number] Arrival date & time: 09/12/22  0941     History  Chief Complaint  Patient presents with   Insect Bite    Willie Moon is a 42 y.o. male presented for evaluation of right thumb injury.  Patient believes that a bug may have bit him on his thumb 2 days ago he did not see insects.  He noticed a small bump to the area he tried to squeeze some pus out of the area.  He describes a mild stinging pain which does not radiate it is somewhat itchy as well.  He denies any fever, chills, pain with motion, injury or any additional concerns.  HPI     Home Medications Prior to Admission medications   Medication Sig Start Date End Date Taking? Authorizing Provider  cephALEXin (KEFLEX) 500 MG capsule Take 1 capsule (500 mg total) by mouth 4 (four) times daily for 7 days. 09/12/22 09/19/22 Yes Nuala Alpha A, PA-C  clindamycin (CLEOCIN) 300 MG capsule Take 1 capsule (300 mg total) by mouth 3 (three) times daily. 07/27/17   Ward, Delice Bison, DO  hydrOXYzine (ATARAX/VISTARIL) 25 MG tablet Take 1 tablet (25 mg total) by mouth every 6 (six) hours. 06/26/19   Hedges, Dellis Filbert, PA-C  ibuprofen (ADVIL) 600 MG tablet Take 1 tablet (600 mg total) by mouth every 6 (six) hours as needed. 07/12/21   Horton, Barbette Hair, MD  methocarbamol (ROBAXIN) 500 MG tablet Take 1 tablet (500 mg total) by mouth 2 (two) times daily. 02/25/20   Tedd Sias, PA  omeprazole (PRILOSEC) 20 MG capsule Take 1 capsule (20 mg total) by mouth daily. 01/26/18   Hedges, Dellis Filbert, PA-C  ranitidine (ZANTAC) 150 MG capsule Take 1 capsule (150 mg total) by mouth daily. 01/26/18   Okey Regal, PA-C      Allergies    Patient has no known allergies.    Review of Systems   Review of Systems  Constitutional: Negative.  Negative for fever.  Musculoskeletal: Negative.  Negative for arthralgias.  Skin:  Positive for wound.    Physical Exam Updated  Vital Signs BP (!) 130/92 (BP Location: Right Arm)   Pulse 74   Temp 98.8 F (37.1 C) (Oral)   Resp 18   SpO2 98%  Physical Exam Constitutional:      General: He is not in acute distress.    Appearance: Normal appearance. He is well-developed. He is not ill-appearing or diaphoretic.  HENT:     Head: Normocephalic and atraumatic.  Eyes:     General: Vision grossly intact. Gaze aligned appropriately.     Pupils: Pupils are equal, round, and reactive to light.  Neck:     Trachea: Trachea and phonation normal.  Pulmonary:     Effort: Pulmonary effort is normal. No respiratory distress.  Abdominal:     General: There is no distension.     Palpations: Abdomen is soft.     Tenderness: There is no abdominal tenderness. There is no guarding or rebound.  Musculoskeletal:        General: Normal range of motion.     Cervical back: Normal range of motion.     Comments: Right thumb has a small subcentimeter wound to the radial side just lateral to the nail.  There is no significant erythema.  No significant swelling or drainage.  Area is minimally tender to palpation.  No tenderness to the pad of the thumb  or on the ulnar side.  No tenderness to the IP joint proximal phalanx or MCP.  Patient has full range of motion of the right thumb without pain.  Strong radial pulse.  Sensation and capillary refill intact.  Compartments soft.  Of note patient has abnormal thumbnail he reports that it was partially removed after an injury several years ago.   Skin:    General: Skin is warm and dry.  Neurological:     Mental Status: He is alert.     GCS: GCS eye subscore is 4. GCS verbal subscore is 5. GCS motor subscore is 6.     Comments: Speech is clear and goal oriented, follows commands Major Cranial nerves without deficit, no facial droop Moves extremities without ataxia, coordination intact  Psychiatric:        Behavior: Behavior normal.     ED Results / Procedures / Treatments   Labs (all labs  ordered are listed, but only abnormal results are displayed) Labs Reviewed - No data to display  EKG None  Radiology DG Finger Thumb Right  Result Date: 09/12/2022 CLINICAL DATA:  Insect bite/ruptured pustule at radial side of distal phalanx. (Never saw a spider/insect.) Right thumb pain, redness, swelling, heat, and tenderness. Noticed 2 days ago. Lanced with pin last night. History of right thumb infection requiring PICC line. History of right thumb injury/bite with now removal. Now deformity remains. EXAM: RIGHT THUMB 2+V COMPARISON:  None Available. FINDINGS: Normal bone mineralization. Joint spaces are preserved. No acute fracture is seen. No dislocation. No soft tissue ulceration is visualized. No subcutaneous air. No cortical erosion. IMPRESSION: No radiographic evidence of osteomyelitis. No soft tissue ulceration is visualized. Electronically Signed   By: Neita Garnet M.D.   On: 09/12/2022 12:37    Procedures Procedures    Medications Ordered in ED Medications - No data to display  ED Course/ Medical Decision Making/ A&P                             Medical Decision Making 73 old male presenting for possible insect bite to his right thumb as seen in picture attached.  On evaluation there is a small superficial wound without significant erythema.  No evidence for felon or paronychia.  I personally reviewed and interpreted three-view radiograph of the right thumb I do not appreciate any obvious evidence for fracture, dislocation, osteomyelitis or any radiopaque foreign bodies.  He does have some pain around the area, show decision making made with patient will treat for possible early cellulitis.  Patient was prescribed Keflex 500 mg 4 times daily for next 1 week.  He was given referral to on-call hand specialist Dr. Steward Drone and I asked him to call the office today to schedule follow-up appointment for recheck sometime this coming week.  I discussed good home wound care and close  observation.  If symptoms worsen patient return immediately to the emergency department.  No indication for labs at this time, vital signs are stable, low suspicion for sepsis, septic joint, abscess, osteomyelitis or other emergent pathologies at this time.  Amount and/or Complexity of Data Reviewed Radiology: ordered.   Patient has adverse reaction to Keflex in the past.  Risk versus benefits of antibiotics were discussed.  At this time there does not appear to be any evidence of an acute emergency medical condition and the patient appears stable for discharge with appropriate outpatient follow up. Diagnosis was discussed with patient who  verbalizes understanding of care plan and is agreeable to discharge. I have discussed return precautions with patient who verbalizes understanding. Patient encouraged to follow-up with their PCP and Dr. Sammuel Hines. All questions answered.     Note: Portions of this report may have been transcribed using voice recognition software. Every effort was made to ensure accuracy; however, inadvertent computerized transcription errors may still be present.         Final Clinical Impression(s) / ED Diagnoses Final diagnoses:  Injury of right thumb, initial encounter    Rx / DC Orders ED Discharge Orders          Ordered    cephALEXin (KEFLEX) 500 MG capsule  4 times daily        09/12/22 1419              Gari Crown 09/12/22 1449    Fredia Sorrow, MD 09/15/22 [phone]

## 2022-09-12 NOTE — ED Notes (Addendum)
C/o R thumb pain, redness, swelling, heat and tenderness. Noticed 2d ago. Lanced with pin last night. H/o R thumb infection needing PICC line. H/o R thumb injury/ bite with nail removal. Nail deformity remains. Reports discomfort radiates to mid axillary upper arm. No obvious migration of redness heat or swelling above hand. Reports "spider bite", but never saw spider or insect.

## 2023-04-28 ENCOUNTER — Encounter (HOSPITAL_BASED_OUTPATIENT_CLINIC_OR_DEPARTMENT_OTHER): Payer: Self-pay | Admitting: Emergency Medicine

## 2023-04-28 ENCOUNTER — Other Ambulatory Visit: Payer: Self-pay

## 2023-04-28 ENCOUNTER — Other Ambulatory Visit (HOSPITAL_BASED_OUTPATIENT_CLINIC_OR_DEPARTMENT_OTHER): Payer: Self-pay

## 2023-04-28 ENCOUNTER — Emergency Department (HOSPITAL_BASED_OUTPATIENT_CLINIC_OR_DEPARTMENT_OTHER)
Admission: EM | Admit: 2023-04-28 | Discharge: 2023-04-28 | Disposition: A | Discharge status: Home / Self Care | Payer: Self-pay | Attending: Emergency Medicine | Admitting: Emergency Medicine

## 2023-04-28 ENCOUNTER — Emergency Department (HOSPITAL_BASED_OUTPATIENT_CLINIC_OR_DEPARTMENT_OTHER): Payer: Self-pay | Admitting: Radiology

## 2023-04-28 DIAGNOSIS — R079 Chest pain, unspecified: Secondary | ICD-10-CM | POA: Insufficient documentation

## 2023-04-28 DIAGNOSIS — Z1152 Encounter for screening for COVID-19: Secondary | ICD-10-CM | POA: Insufficient documentation

## 2023-04-28 DIAGNOSIS — I309 Acute pericarditis, unspecified: Secondary | ICD-10-CM

## 2023-04-28 DIAGNOSIS — J069 Acute upper respiratory infection, unspecified: Secondary | ICD-10-CM | POA: Insufficient documentation

## 2023-04-28 DIAGNOSIS — R9431 Abnormal electrocardiogram [ECG] [EKG]: Secondary | ICD-10-CM | POA: Insufficient documentation

## 2023-04-28 LAB — RESP PANEL BY RT-PCR (RSV, FLU A&B, COVID)  RVPGX2
Influenza A by PCR: NEGATIVE
Influenza B by PCR: NEGATIVE
Resp Syncytial Virus by PCR: NEGATIVE
SARS Coronavirus 2 by RT PCR: NEGATIVE

## 2023-04-28 LAB — BASIC METABOLIC PANEL
Anion gap: 9 (ref 5–15)
BUN: 10 mg/dL (ref 6–20)
CO2: 28 mmol/L (ref 22–32)
Calcium: 9.1 mg/dL (ref 8.9–10.3)
Chloride: 103 mmol/L (ref 98–111)
Creatinine, Ser: 0.88 mg/dL (ref 0.61–1.24)
GFR, Estimated: >60 mL/min (ref >60)
Glucose, Bld: 70 mg/dL (ref 70–99)
Potassium: 3.7 mmol/L (ref 3.5–5.1)
Sodium: 140 mmol/L (ref 135–145)

## 2023-04-28 LAB — CBC WITH DIFFERENTIAL/PLATELET
Abs Immature Granulocytes: 0.01 10*3/uL (ref 0.00–0.07)
Basophils Absolute: 0.1 10*3/uL (ref 0.0–0.1)
Basophils Relative: 1 %
Eosinophils Absolute: 0.2 10*3/uL (ref 0.0–0.5)
Eosinophils Relative: 4 %
HCT: 41.5 % (ref 39.0–52.0)
Hemoglobin: 14.1 g/dL (ref 13.0–17.0)
Immature Granulocytes: 0 %
Lymphocytes Relative: 26 %
Lymphs Abs: 1.4 10*3/uL (ref 0.7–4.0)
MCH: 32 pg (ref 26.0–34.0)
MCHC: 34 g/dL (ref 30.0–36.0)
MCV: 94.1 fL (ref 80.0–100.0)
Monocytes Absolute: 0.7 10*3/uL (ref 0.1–1.0)
Monocytes Relative: 13 %
Neutro Abs: 3 10*3/uL (ref 1.7–7.7)
Neutrophils Relative %: 56 %
Platelets: 226 10*3/uL (ref 150–400)
RBC: 4.41 MIL/uL (ref 4.22–5.81)
RDW: 14.1 % (ref 11.5–15.5)
WBC: 5.4 10*3/uL (ref 4.0–10.5)
nRBC: 0 % (ref 0.0–0.2)

## 2023-04-28 LAB — GROUP A STREP BY PCR: Group A Strep by PCR: NOT DETECTED

## 2023-04-28 LAB — D-DIMER, QUANTITATIVE: D-Dimer, Quant: <0.27 ug{FEU}/mL (ref 0.00–0.50)

## 2023-04-28 LAB — TROPONIN I (HIGH SENSITIVITY)
Troponin I (High Sensitivity): <2 ng/L (ref <18)
Troponin I (High Sensitivity): <2 ng/L (ref <18)

## 2023-04-28 LAB — C-REACTIVE PROTEIN: CRP: 0.9 mg/dL (ref <1.0)

## 2023-04-28 LAB — SEDIMENTATION RATE: Sed Rate: 32 mm/h — ABNORMAL HIGH (ref 0–16)

## 2023-04-28 MED ORDER — IBUPROFEN 600 MG PO TABS
600.0000 mg | ORAL_TABLET | Freq: Three times a day (TID) | ORAL | 0 refills | Status: AC
Start: 2023-04-28 — End: 2023-05-08
  Filled 2023-04-28: qty 30, 10d supply, fill #0

## 2023-04-28 MED ORDER — GUAIFENESIN-CODEINE 100-10 MG/5ML PO SOLN
10.0000 mL | Freq: Four times a day (QID) | ORAL | 0 refills | Status: AC | PRN
Start: 2023-04-28 — End: ?
  Filled 2023-04-28: qty 120, 3d supply, fill #0

## 2023-04-28 MED ORDER — OXYMETAZOLINE HCL 0.05 % NA SOLN
2.0000 | Freq: Once | NASAL | Status: AC
  Administered 2023-04-28: 2 via NASAL
  Filled 2023-04-28: qty 30

## 2023-04-28 MED ORDER — COLCHICINE 0.6 MG PO TABS
0.6000 mg | ORAL_TABLET | Freq: Two times a day (BID) | ORAL | 0 refills | Status: AC
Start: 2023-04-28 — End: 2023-05-28
  Filled 2023-04-28: qty 60, 30d supply, fill #0

## 2023-04-28 MED ORDER — ACETAMINOPHEN 325 MG PO TABS
650.0000 mg | ORAL_TABLET | Freq: Once | ORAL | Status: AC
  Administered 2023-04-28: 650 mg via ORAL
  Filled 2023-04-28: qty 2

## 2023-04-28 MED ORDER — IBUPROFEN 800 MG PO TABS
800.0000 mg | ORAL_TABLET | Freq: Once | ORAL | Status: AC
  Administered 2023-04-28: 800 mg via ORAL
  Filled 2023-04-28: qty 1

## 2023-04-28 NOTE — ED Provider Notes (Signed)
[place] EMERGENCY DEPARTMENT AT [facility] Provider Note   CSN: [id number] Arrival date & time: 04/28/23  0350     History  Chief Complaint  Patient presents with   Nasal Congestion   Cough    Willie Moon is a 42 y.o. male.  Patient with no significant past medical history presents with a 1 day history of sinus congestion, pressure in his face, cough productive of clear mucus, sore throat, chest pain with coughing.  Mild shortness of breath as well.  No history of asthma.  No travel or sick contacts.  No known fever.  Using over-the-counter allergy pills without relief.  States started to have nasal congestion and felt like his allergies are flaring up yesterday.  Has since progressed to involve pressure in his face, sore throat, cough and bodyaches and headache.  No shortness of breath.  No abdominal pain, vomiting or diarrhea.  The history is provided by the patient.  Cough Associated symptoms: rhinorrhea and sore throat   Associated symptoms: no chest pain, no fever, no headaches, no myalgias and no rash        Home Medications Prior to Admission medications   Medication Sig Start Date End Date Taking? Authorizing Provider  clindamycin (CLEOCIN) 300 MG capsule Take 1 capsule (300 mg total) by mouth 3 (three) times daily. 07/27/17   Ward, Layla Maw, DO  hydrOXYzine (ATARAX/VISTARIL) 25 MG tablet Take 1 tablet (25 mg total) by mouth every 6 (six) hours. 06/26/19   Hedges, Tinnie Gens, PA-C  ibuprofen (ADVIL) 600 MG tablet Take 1 tablet (600 mg total) by mouth every 6 (six) hours as needed. 07/12/21   Horton, Mayer Masker, MD  methocarbamol (ROBAXIN) 500 MG tablet Take 1 tablet (500 mg total) by mouth 2 (two) times daily. 02/25/20   Gailen Shelter, PA  omeprazole (PRILOSEC) 20 MG capsule Take 1 capsule (20 mg total) by mouth daily. 01/26/18   Hedges, Tinnie Gens, PA-C  ranitidine (ZANTAC) 150 MG capsule Take 1 capsule (150 mg total) by mouth daily. 01/26/18   Eyvonne Mechanic,  PA-C      Allergies    Patient has no known allergies.    Review of Systems   Review of Systems  Constitutional:  Positive for fatigue. Negative for fever.  HENT:  Positive for congestion, dental problem, rhinorrhea and sore throat.   Respiratory:  Positive for cough.   Cardiovascular:  Negative for chest pain.  Gastrointestinal:  Negative for abdominal pain, nausea and vomiting.  Genitourinary:  Negative for dysuria and hematuria.  Musculoskeletal:  Negative for arthralgias and myalgias.  Skin:  Negative for rash.  Neurological:  Negative for dizziness, weakness and headaches.   all other systems are negative except as noted in the HPI and PMH.    Physical Exam Updated Vital Signs BP (!) 164/107   Pulse 72   Temp 98.6 F (37 C) (Oral)   Resp 18   Wt 99.8 kg   SpO2 98%   BMI 30.69 kg/m  Physical Exam Vitals and nursing note reviewed.  Constitutional:      General: He is not in acute distress.    Appearance: He is well-developed. He is not ill-appearing.  HENT:     Head: Normocephalic and atraumatic.     Nose: Congestion present.     Mouth/Throat:     Pharynx: Posterior oropharyngeal erythema present. No oropharyngeal exudate.     Comments: Large tonsils, erythematous, no exudates, uvula midline Eyes:     Conjunctiva/sclera: Conjunctivae  normal.     Pupils: Pupils are equal, round, and reactive to light.  Neck:     Comments: No meningismus. Cardiovascular:     Rate and Rhythm: Normal rate and regular rhythm.     Heart sounds: Normal heart sounds. No murmur heard. Pulmonary:     Effort: Pulmonary effort is normal. No respiratory distress.     Breath sounds: Normal breath sounds.  Chest:     Chest wall: No tenderness.  Abdominal:     Palpations: Abdomen is soft.     Tenderness: There is no abdominal tenderness. There is no guarding or rebound.  Musculoskeletal:        General: No tenderness. Normal range of motion.     Cervical back: Normal range of motion and  neck supple.  Lymphadenopathy:     Cervical: Cervical adenopathy present.  Skin:    General: Skin is warm.     Findings: No rash.  Neurological:     General: No focal deficit present.     Mental Status: He is alert and oriented to person, place, and time. Mental status is at baseline.     Cranial Nerves: No cranial nerve deficit.     Motor: No abnormal muscle tone.     Coordination: Coordination normal.     Comments:  5/5 strength throughout. CN 2-12 intact.Equal grip strength.   Psychiatric:        Behavior: Behavior normal.     ED Results / Procedures / Treatments   Labs (all labs ordered are listed, but only abnormal results are displayed) Labs Reviewed  SEDIMENTATION RATE - Abnormal; Notable for the following components:      Result Value   Sed Rate 32 (*)    All other components within normal limits  RESP PANEL BY RT-PCR (RSV, FLU A&B, COVID)  [id number]  GROUP A STREP BY PCR  CBC WITH DIFFERENTIAL/PLATELET  BASIC METABOLIC PANEL  D-DIMER, QUANTITATIVE  C-REACTIVE PROTEIN  TROPONIN I (HIGH SENSITIVITY)  TROPONIN I (HIGH SENSITIVITY)    EKG EKG Interpretation Date/Time:  Thursday April 28 2023 05:00:43 EDT Ventricular Rate:  69 PR Interval:  148 QRS Duration:  96 QT Interval:  352 QTC Calculation: 377 R Axis:   38  Text Interpretation: Sinus rhythm similar ST elevation from earlier same day Confirmed by Glynn Octave [phone]) on 04/28/2023 5:07:46 AM  Radiology DG Chest 2 View  Result Date: 04/28/2023 CLINICAL DATA:  Chest pain.  Sinus congestion and cough. EXAM: CHEST - 2 VIEW COMPARISON:  None Available. FINDINGS: Normal heart size and mediastinal contours. No acute infiltrate or edema. No effusion or pneumothorax. No acute osseous findings. Artifact from EKG leads. IMPRESSION: No active cardiopulmonary disease. Electronically Signed   By: Tiburcio Pea M.D.   On: 04/28/2023 07:12    Procedures Procedures    Medications Ordered in ED Medications   acetaminophen (TYLENOL) tablet 650 mg (has no administration in time range)  ibuprofen (ADVIL) tablet 800 mg (has no administration in time range)    ED Course/ Medical Decision Making/ A&P                                 Medical Decision Making Amount and/or Complexity of Data Reviewed Labs: ordered. Decision-making details documented in ED Course. Radiology: ordered and independent interpretation performed. Decision-making details documented in ED Course. ECG/medicine tests: ordered and independent interpretation performed. Decision-making details documented in ED Course.  Risk  OTC drugs. Prescription drug management.   1 day of URI symptoms of congestion, cough, sore throat and chest pain with coughing.  No hypoxia or increased work of breathing.  EKG does show ST elevation in lead I, aVL, V2 and V3.  ST depression in lead III.  No comparison.  Patient does have chest pain but appears mostly atypical and worse with coughing and worse with palpation.  Discussed with Dr. Brayton Layman of cardiology who reviewed EKG.  He agrees likely early repolarization does not recommend activating code STEMI at this time.  Patient texting on phone in no distress.  COVID and flu swabs are negative.  Rapid strep test is negative.  Labs reassuring with negative D-dimer. Troponin is negative.  Will trend.  Patient's chest pain seems atypical for ACS.  Suspect likely viral syndrome causing his congestion, cough and sore throat.  COVID and flu swabs are negative as above.  D/w Dr. Lynnette Caffey of interventional cardiology who agrees EKG not consistent with STEMI.  He favors pericarditis versus early repolarization.  Recommends checking ESR and CRP.  On recheck, patient feels improved.  He is tolerating p.o.  Second troponin and chest x-ray pending at shift change.  Anticipate discharge home with supportive care for likely viral URI.  Discussed COVID and flu are still possible despite negative test  today.  Care transferred to Dr. Wallace Cullens pending second troponin and chest x-ray.       Final Clinical Impression(s) / ED Diagnoses Final diagnoses:  Viral URI with cough  Abnormal EKG    Rx / DC Orders ED Discharge Orders     None         Tyreonna Czaplicki, Jeannett Senior, MD 04/28/23 [phone]

## 2023-04-28 NOTE — Discharge Instructions (Addendum)
We suspect you have a viral syndrome causing your sinus congestion, headache, cough and sore throat.  COVID and flu tests are negative today but these viruses are still possible as the tests are not perfect.  Keep yourself hydrated at home.  Use Tylenol or Motrin as needed for aches and for fever.  Follow-up with your primary doctor.  You are being referred to the cardiologist as well as you have an abnormal EKG and would benefit from further evaluation such as an echocardiogram.  You have minimal elevation of an inflammatory markers.  This could be the start of what we call a pericarditis which is an inflammatory state of the heart after a viral infection.  I will start you on Motrin and colchicine to cover you just in case but please follow-up with cardiology for further management.  Rest and recover.  Increase oral hydration.  Return to the ED with exertional chest pain, pain associate with shortness of breath, nausea, vomiting, sweating or any other concerns.

## 2023-04-28 NOTE — ED Triage Notes (Signed)
Pt in with sinus congestion and cough that began yesterday. Reports clear sputum, denies fevers, or sob

## 2023-04-28 NOTE — ED Provider Notes (Signed)
9:41 AM Patient signed out to me by previous ED physician. Pt is a 42 yo male presenting for viral syndrome as well as chest pain.   Plan: CRP, SED, cardiac workup/myocarditis/pericarditis. DC home with symptomatic management for viral syndrome if stable.      Physical Exam  BP (!) 132/103   Pulse 74   Temp 98.5 F (36.9 C) (Oral)   Resp 12   Wt 99.8 kg   SpO2 98%   BMI 30.69 kg/m   Physical Exam  Procedures  Procedures  ED Course / MDM    Medical Decision Making Amount and/or Complexity of Data Reviewed Labs: ordered. Radiology: ordered. ECG/medicine tests: ordered.  Risk OTC drugs. Prescription drug management.   Patient is alert or x 3, no acute distress, afebrile, stable to signs.  No COVID or flu.  No leukocytosis.  No signs or symptoms of sepsis.  Originally patient had some EKG changes and cardiology was consulted for concerns for STEMI and/or myocarditis versus pericarditis.  No previous EKGs to compare.  Patient did have flat troponins x 2.  His chest pain has improved with symptomatic management.  Cardiology recommended inflammatory markers such as sed and CRP.  D-dimer was negative.  Sed rate was minimally elevated at 32.  This may be the start of a possible pericarditis.  Will cover with Motrin and colchicine just in case.  Patient given strict no exercise instructions.  Instructions to rest, increase or ration, and vitamin C intake.  Afrin given for nasal congestion.  Robitussin with codeine given for cough.  Ambulatory referral for cardiology placed for close follow-up.  Patient highly recommend to return to emergency department for any worsening concerning signs or symptoms including worsening chest pain or fevers.  Neck rigidity or stiffness low concern for meningitis at this time.      Edwin Dada P, DO 04/28/23 [phone]

## 2023-05-10 ENCOUNTER — Other Ambulatory Visit (HOSPITAL_BASED_OUTPATIENT_CLINIC_OR_DEPARTMENT_OTHER): Payer: Self-pay

## 2023-09-11 ENCOUNTER — Emergency Department (HOSPITAL_BASED_OUTPATIENT_CLINIC_OR_DEPARTMENT_OTHER)
Admission: EM | Admit: 2023-09-11 | Discharge: 2023-09-11 | Disposition: A | Discharge status: Home / Self Care | Payer: Self-pay | Attending: Emergency Medicine | Admitting: Emergency Medicine

## 2023-09-11 ENCOUNTER — Emergency Department (HOSPITAL_BASED_OUTPATIENT_CLINIC_OR_DEPARTMENT_OTHER): Payer: Self-pay

## 2023-09-11 ENCOUNTER — Encounter (HOSPITAL_BASED_OUTPATIENT_CLINIC_OR_DEPARTMENT_OTHER): Payer: Self-pay | Admitting: Emergency Medicine

## 2023-09-11 ENCOUNTER — Other Ambulatory Visit: Payer: Self-pay

## 2023-09-11 DIAGNOSIS — R591 Generalized enlarged lymph nodes: Secondary | ICD-10-CM | POA: Insufficient documentation

## 2023-09-11 LAB — CBC WITH DIFFERENTIAL/PLATELET
Abs Immature Granulocytes: 0.01 10*3/uL (ref 0.00–0.07)
Basophils Absolute: 0.1 10*3/uL (ref 0.0–0.1)
Basophils Relative: 1 %
Eosinophils Absolute: 0.1 10*3/uL (ref 0.0–0.5)
Eosinophils Relative: 1 %
HCT: 43.7 % (ref 39.0–52.0)
Hemoglobin: 14.7 g/dL (ref 13.0–17.0)
Immature Granulocytes: 0 %
Lymphocytes Relative: 38 %
Lymphs Abs: 2.3 10*3/uL (ref 0.7–4.0)
MCH: 31.1 pg (ref 26.0–34.0)
MCHC: 33.6 g/dL (ref 30.0–36.0)
MCV: 92.6 fL (ref 80.0–100.0)
Monocytes Absolute: 0.6 10*3/uL (ref 0.1–1.0)
Monocytes Relative: 10 %
Neutro Abs: 3 10*3/uL (ref 1.7–7.7)
Neutrophils Relative %: 50 %
Platelets: 277 10*3/uL (ref 150–400)
RBC: 4.72 MIL/uL (ref 4.22–5.81)
RDW: 13.5 % (ref 11.5–15.5)
WBC: 6 10*3/uL (ref 4.0–10.5)
nRBC: 0 % (ref 0.0–0.2)

## 2023-09-11 LAB — URINALYSIS, ROUTINE W REFLEX MICROSCOPIC
Bacteria, UA: NONE SEEN
Bilirubin Urine: NEGATIVE
Glucose, UA: NEGATIVE mg/dL
Ketones, ur: NEGATIVE mg/dL
Leukocytes,Ua: NEGATIVE
Nitrite: NEGATIVE
Specific Gravity, Urine: 1.022 (ref 1.005–1.030)
pH: 6.5 (ref 5.0–8.0)

## 2023-09-11 LAB — COMPREHENSIVE METABOLIC PANEL
ALT: 33 U/L (ref 0–44)
AST: 31 U/L (ref 15–41)
Albumin: 4.6 g/dL (ref 3.5–5.0)
Alkaline Phosphatase: 77 U/L (ref 38–126)
Anion gap: 7 (ref 5–15)
BUN: 11 mg/dL (ref 6–20)
CO2: 27 mmol/L (ref 22–32)
Calcium: 9.8 mg/dL (ref 8.9–10.3)
Chloride: 98 mmol/L (ref 98–111)
Creatinine, Ser: 0.99 mg/dL (ref 0.61–1.24)
GFR, Estimated: >60 mL/min (ref >60)
Glucose, Bld: 95 mg/dL (ref 70–99)
Potassium: 3.9 mmol/L (ref 3.5–5.1)
Sodium: 132 mmol/L — ABNORMAL LOW (ref 135–145)
Total Bilirubin: 0.9 mg/dL (ref 0.0–1.2)
Total Protein: 8.2 g/dL — ABNORMAL HIGH (ref 6.5–8.1)

## 2023-09-11 MED ORDER — IOHEXOL 300 MG/ML  SOLN
100.0000 mL | Freq: Once | INTRAMUSCULAR | Status: AC | PRN
  Administered 2023-09-11: 100 mL via INTRAVENOUS

## 2023-09-11 NOTE — ED Triage Notes (Signed)
Right groin pain and the pain is in his penis also when moves. Started 2 days ago. No voiding difficulties or pain when voiding. No fevers

## 2023-09-11 NOTE — Discharge Instructions (Addendum)
While you are in the emergency room, you had blood work done that overall was normal.  Your sodium was a little bit low, drink a Gatorade on the way home and you should be fine.  Your CT scan did show a small kidney stone on the left side that is not causing any blockages, likely not causing any problems.  I think there is a chance that you could have recently passed a kidney stone that could be what was causing the discomfort that you are feeling.  The swelling in your right groin should go down over the next 1 to 2 weeks.  Would be contacted if any of the additional test that we had to send away come back positive.  Please follow-up with your primary care doctor.  If you do not have a primary care doctor, you can establish care with a telephone number in your discharge paperwork.  Return to the emergency room if you develop any new or worsening pain.

## 2023-09-11 NOTE — ED Provider Notes (Signed)
[place] EMERGENCY DEPARTMENT AT [facility] Provider Note   CSN: [id number] Arrival date & time: 09/11/23  1028     History  No chief complaint on file.   Willie Moon is a 43 y.o. male.  43 year old male here today for right groin pain.  Patient noticed 2 days ago he started to have some pain, and tenderness in the right groin.  He denies any fever, chills, night sweats, recent travel, bites or scratches.  He denies any new or recent sexual partners.  He sometimes feels as though the pain radiates to his penis.  Denies any testicular pain.        Home Medications Prior to Admission medications   Medication Sig Start Date End Date Taking? Authorizing Provider  clindamycin (CLEOCIN) 300 MG capsule Take 1 capsule (300 mg total) by mouth 3 (three) times daily. 07/27/17   Ward, Layla Maw, DO  colchicine 0.6 MG tablet Take 1 tablet (0.6 mg total) by mouth 2 (two) times daily. 04/28/23 05/28/23  Edwin Dada P, DO  guaiFENesin-codeine 100-10 MG/5ML syrup Take 10 mLs by mouth every 6 (six) hours as needed for cough. 04/28/23   Edwin Dada P, DO  hydrOXYzine (ATARAX/VISTARIL) 25 MG tablet Take 1 tablet (25 mg total) by mouth every 6 (six) hours. 06/26/19   Hedges, Tinnie Gens, PA-C  methocarbamol (ROBAXIN) 500 MG tablet Take 1 tablet (500 mg total) by mouth 2 (two) times daily. 02/25/20   Gailen Shelter, PA  omeprazole (PRILOSEC) 20 MG capsule Take 1 capsule (20 mg total) by mouth daily. 01/26/18   Hedges, Tinnie Gens, PA-C  ranitidine (ZANTAC) 150 MG capsule Take 1 capsule (150 mg total) by mouth daily. 01/26/18   Eyvonne Mechanic, PA-C      Allergies    Patient has no known allergies.    Review of Systems   Review of Systems  Physical Exam Updated Vital Signs BP (!) 146/100 (BP Location: Right Arm)   Pulse 69   Temp 99.3 F (37.4 C)   Resp 16   Wt 97.5 kg   SpO2 96%   BMI 29.99 kg/m  Physical Exam Vitals and nursing note reviewed. Exam conducted with a chaperone present.   Abdominal:     Comments: Right sided inguinal adenopathy  Genitourinary:    Comments: Normal-appearing penis.  No discharge, no rash, no lesion.  Bilateral descended testes, normal lie. Neurological:     Mental Status: He is alert.     ED Results / Procedures / Treatments   Labs (all labs ordered are listed, but only abnormal results are displayed) Labs Reviewed  COMPREHENSIVE METABOLIC PANEL - Abnormal; Notable for the following components:      Result Value   Sodium 132 (*)    Total Protein 8.2 (*)    All other components within normal limits  URINALYSIS, ROUTINE W REFLEX MICROSCOPIC - Abnormal; Notable for the following components:   Hgb urine dipstick TRACE (*)    Protein, ur TRACE (*)    All other components within normal limits  CBC WITH DIFFERENTIAL/PLATELET  RPR  HIV ANTIBODY (ROUTINE TESTING W REFLEX)  GC/CHLAMYDIA PROBE AMP ([place]) NOT AT [facility]    EKG None  Radiology CT ABDOMEN PELVIS W CONTRAST Result Date: 09/11/2023 CLINICAL DATA:  Right groin pain for 2 days EXAM: CT ABDOMEN AND PELVIS WITH CONTRAST TECHNIQUE: Multidetector CT imaging of the abdomen and pelvis was performed using the standard protocol following bolus administration of intravenous contrast. RADIATION DOSE REDUCTION: This exam  was performed according to the departmental dose-optimization program which includes automated exposure control, adjustment of the mA and/or kV according to patient size and/or use of iterative reconstruction technique. CONTRAST:  OMNIPAQUE IOHEXOL 300 MG/ML  SOLN COMPARISON:  None Available. FINDINGS: Lower chest: Lung bases are grossly clear. No pleural effusion. Slight breathing motion. Hepatobiliary: No focal liver abnormality is seen. No gallstones, gallbladder wall thickening, or biliary dilatation. Patent portal vein. Pancreas: Unremarkable. No pancreatic ductal dilatation or surrounding inflammatory changes. Spleen: Normal in size without focal abnormality.  Adrenals/Urinary Tract: Adrenal glands are preserved. No enhancing renal mass or collecting system dilatation. On coronal imaging there is a punctate left-sided nonobstructing renal stone measuring 1-2 mm on series 5, image 55. Bifid renal pelvis noted bilaterally. The ureters have normal course and caliber down to the bladder. Bladder is contracted. Stomach/Bowel: Stomach is within normal limits. Appendix appears normal. No evidence of bowel wall thickening, distention, or inflammatory changes. Vascular/Lymphatic: No significant vascular findings are present. No enlarged abdominal or pelvic lymph nodes. Reproductive: Prostate is unremarkable. Other: No abdominal wall hernia or abnormality. No abdominopelvic ascites. Musculoskeletal: No acute or significant osseous findings. IMPRESSION: No bowel obstruction, free air or free fluid. Normal appendix. Scattered stool. Punctate nonobstructing left-sided renal stone. Electronically Signed   By: Karen Kays M.D.   On: 09/11/2023 12:49    Procedures Procedures    Medications Ordered in ED Medications  iohexol (OMNIPAQUE) 300 MG/ML solution 100 mL (100 mLs Intravenous Contrast Given 09/11/23 1203)    ED Course/ Medical Decision Making/ A&P                                 Medical Decision Making 44 year old male here today with right groin pain.  Differential diagnoses include reactive lymphadenopathy, lymphoproliferative disease, STI, nephrolithiasis.  Plan-patient does have an appreciable lymphadenopathy in the right groin.  Patient denies any recent sexual history, recent history of unprotected sex.  HIV, gonorrhea, chlamydia, RPR ordered.  Urinalysis ordered.  Basic labs ordered.  Symptoms with the radiation of pain to the penis could be symptom of stone.  Will likely obtain imaging.  Do not of an obvious infectious cause in this patient.  Does not appear systemically ill.  Could simply be reactive lymphadenopathy to occult virus.  Reassessment  11:50 AM-patient does have trace blood and protein in his urine.  Will obtain CT imaging of the abdomen pelvis.  This will last both assess the kidneys, as well as the area of swelling.  Reassessment 1 PM-patient's CT imaging shows punctate stone on the left side, patient may have passed a stone.  Blood work does not show any evidence of lymphoproliferative disease.  Will not [place] treat patient for STI.  Discussed patient's results with him, told him we will contact him if he had any positive results.  Sodium a bit low, advised patient to buy Gatorade on the way home.  Amount and/or Complexity of Data Reviewed Labs: ordered. Radiology: ordered.  Risk Prescription drug management.           Final Clinical Impression(s) / ED Diagnoses Final diagnoses:  Lymphadenopathy    Rx / DC Orders ED Discharge Orders     None         Anders Simmonds T, DO 09/11/23 1301

## 2023-09-12 LAB — RPR: RPR Ser Ql: NONREACTIVE

## 2023-09-12 LAB — HIV ANTIBODY (ROUTINE TESTING W REFLEX): HIV Screen 4th Generation wRfx: NONREACTIVE

## 2023-09-13 LAB — GC/CHLAMYDIA PROBE AMP (~~LOC~~) NOT AT ARMC
Chlamydia: NEGATIVE
Comment: NEGATIVE
Comment: NORMAL
Neisseria Gonorrhea: NEGATIVE
